# Patient Record
Sex: Male | Born: 2000 | Race: White | Hispanic: No | Marital: Single | Smoking: Never smoker
Health system: Southern US, Community
[De-identification: ages and names within clinical notes are randomized; demographics above are authoritative.]

---
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
## 2022-08-25 ENCOUNTER — Emergency Department (HOSPITAL_COMMUNITY)
Admission: EM | Admit: 2022-08-25 | Discharge: 2022-08-25 | Discharge status: Against Medical Advice | Payer: Managed Care, Other (non HMO) | Attending: Emergency Medicine | Admitting: Emergency Medicine

## 2022-08-25 ENCOUNTER — Ambulatory Visit
Admission: EM | Admit: 2022-08-25 | Discharge: 2022-08-25 | Disposition: A | Discharge status: Home / Self Care | Payer: Managed Care, Other (non HMO) | Attending: Internal Medicine | Admitting: Internal Medicine

## 2022-08-25 ENCOUNTER — Encounter (HOSPITAL_COMMUNITY): Payer: Self-pay

## 2022-08-25 ENCOUNTER — Other Ambulatory Visit: Payer: Self-pay

## 2022-08-25 DIAGNOSIS — T1592XA Foreign body on external eye, part unspecified, left eye, initial encounter: Secondary | ICD-10-CM

## 2022-08-25 DIAGNOSIS — Z5321 Procedure and treatment not carried out due to patient leaving prior to being seen by health care provider: Secondary | ICD-10-CM | POA: Insufficient documentation

## 2022-08-25 DIAGNOSIS — H5789 Other specified disorders of eye and adnexa: Secondary | ICD-10-CM | POA: Diagnosis present

## 2022-08-25 MED ORDER — TETRACAINE HCL 0.5 % OP SOLN: 1.0000 [drp] | Freq: Once | OPHTHALMIC | Status: DC

## 2022-08-25 NOTE — ED Provider Triage Note (Signed)
Emergency Medicine Provider Triage Evaluation Note  Harry Snyder , a 21 y.o. male  was evaluated in triage.  Pt complains of L eye irritation. Was wearing protective glasses while using a metal grinder. Began to notice eye irritation tonight. Tried flushing his eye w/o relief. Does not wear corrective lenses. Denies changes in vision.  Review of Systems  Positive: As above Negative: As above  Physical Exam  BP (!) 152/97 (BP Location: Right Arm)   Pulse 74   Temp 98.2 F (36.8 C) (Oral)   Resp 16   Ht 5\' 11"  (1.803 m)   Wt 99.8 kg   SpO2 100%   BMI 30.68 kg/m  Gen:   Awake, no distress   Resp:  Normal effort  MSK:   Moves extremities without difficulty  Other:  Foreign body left cornea with mild conjunctival injection.  Medical Decision Making  Medically screening exam initiated at 1:49 AM.  Appropriate orders placed.  Harry Snyder was informed that the remainder of the evaluation will be completed by another provider, this initial triage assessment does not replace that evaluation, and the importance of remaining in the ED until their evaluation is complete.  Corneal FB   Ulis Rias, PA-C 08/25/22 0153

## 2022-08-25 NOTE — ED Notes (Signed)
Patient states the wait is long and he is leaving 

## 2022-08-25 NOTE — Discharge Instructions (Signed)
Please go straight to [facility] ophthalmology as soon as you leave urgent care for further evaluation and management.

## 2022-08-25 NOTE — ED Provider Notes (Signed)
[facility]    CSN: [id number] Arrival date & time: 08/25/22  4801      History   Chief Complaint Chief Complaint  Patient presents with   Eye Injury    HPI Harry Snyder is a 21 y.o. male.   Patient presents with left eye irritation that started yesterday.  Patient reports that he thinks that he got a piece of metal in his eye.  Patient reports that he was grinding metal yesterday and felt something hit his eye.  Although, he was wearing eye protection.  He states that he can see the piece of metal in his eye but was not able to remove after washing out his eye.  Denies any blurry vision.  Patient does not wear glasses or contacts.   Eye Injury    History reviewed. No pertinent past medical history.  There are no problems to display for this patient.   History reviewed. No pertinent surgical history.     Home Medications    Prior to Admission medications   Not on File    Family History History reviewed. No pertinent family history.  Social History Social History   Tobacco Use   Smoking status: Never   Smokeless tobacco: Never     Allergies   Patient has no known allergies.   Review of Systems Review of Systems Per HPI  Physical Exam Triage Vital Signs ED Triage Vitals  Enc Vitals Group     BP 08/25/22 0849 122/79     Pulse Rate 08/25/22 0849 61     Resp 08/25/22 0849 15     Temp 08/25/22 0849 98.3 F (36.8 C)     Temp Source 08/25/22 0849 Oral     SpO2 08/25/22 0849 98 %     Weight --      Height --      Head Circumference --      Peak Flow --      Pain Score 08/25/22 0848 3     Pain Loc --      Pain Edu? --      Excl. in GC? --    No data found.  Updated Vital Signs BP 122/79   Pulse 61   Temp 98.3 F (36.8 C) (Oral)   Resp 15   SpO2 98%   Visual Acuity Right Eye Distance: 20/20 Left Eye Distance: 20/20 Bilateral Distance: 20/20  Right Eye Near:   Left Eye Near:    Bilateral Near:     Physical  Exam Constitutional:      General: He is not in acute distress.    Appearance: Normal appearance. He is not toxic-appearing or diaphoretic.  HENT:     Head: Normocephalic and atraumatic.  Eyes:     General: Lids are normal. Vision grossly intact. Gaze aligned appropriately.     Extraocular Movements: Extraocular movements intact.     Conjunctiva/sclera: Conjunctivae normal.     Pupils: Pupils are equal, round, and reactive to light.      Comments: Black discolored foreign body present to anterior cornea resembling possible piece of metal.  Pulmonary:     Effort: Pulmonary effort is normal.  Neurological:     General: No focal deficit present.     Mental Status: He is alert and oriented to person, place, and time. Mental status is at baseline.  Psychiatric:        Mood and Affect: Mood normal.        Behavior: Behavior normal.  Thought Content: Thought content normal.        Judgment: Judgment normal.      UC Treatments / Results  Labs (all labs ordered are listed, but only abnormal results are displayed) Labs Reviewed - No data to display  EKG   Radiology No results found.  Procedures Procedures (including critical care time)  Medications Ordered in UC Medications - No data to display  Initial Impression / Assessment and Plan / UC Course  I have reviewed the triage vital signs and the nursing notes.  Pertinent labs & imaging results that were available during my care of the patient were reviewed by me and considered in my medical decision making (see chart for details).     Eye was flushed out unsuccessfully.  Tetracaine eyedrops administered with a second attempt to gently remove foreign body with sterile Q-tip which was also unsuccessful.  Therefore, patient was advised that he will need to see ophthalmologist as soon as possible for further evaluation and management.  Called [place] [facility] to schedule patient appointment today to be seen by  ophthalmology.  They advised for patient to come straight over to [facility] ophthalmology for further evaluation.  Patient was agreeable with this plan and left urgent care to go straight to [facility] ophthalmology. Final Clinical Impressions(s) / UC Diagnoses   Final diagnoses:  Foreign body of left eye, initial encounter     Discharge Instructions      Please go straight to [facility] ophthalmology as soon as you leave urgent care for further evaluation and management.    ED Prescriptions   None    PDMP not reviewed this encounter.   Gustavus Bryant, [place] 08/25/22 [phone]

## 2022-08-25 NOTE — ED Triage Notes (Signed)
Pt arrived to triage concerned for metal in in left eye. States he was drinding metal this afternoon and didn't start to feel any pain until he went to bed.   States you can see a small piece in the eye. Attempted to flush with water without relief.   Denies vision changes and no bleeding/discharge

## 2022-08-25 NOTE — ED Triage Notes (Signed)
Pt presents to uc with co of eye irritation to the L eye since yesterday at 12 noon. Pt reports he was grinding some metal and noticed the pain after the fact. Pt reports he was wearing his eye protection. Pain I s worse when closing his eye.
